# Patient Record
Sex: Male | Born: 1945 | Race: White | Hispanic: No | Marital: Single
Health system: Southern US, Community
[De-identification: ages and names within clinical notes are randomized; demographics above are authoritative.]

---

## 2016-05-13 ENCOUNTER — Inpatient Hospital Stay
Admission: RE | Admit: 2016-05-13 | Discharge: 2016-06-04 | Disposition: A | Discharge status: Skilled Nursing Facility | Payer: Medicare Other | Source: Other Acute Inpatient Hospital | Attending: Internal Medicine | Admitting: Internal Medicine

## 2016-05-13 DIAGNOSIS — Z4659 Encounter for fitting and adjustment of other gastrointestinal appliance and device: Secondary | ICD-10-CM

## 2016-05-13 DIAGNOSIS — J9690 Respiratory failure, unspecified, unspecified whether with hypoxia or hypercapnia: Secondary | ICD-10-CM

## 2016-05-13 DIAGNOSIS — R1310 Dysphagia, unspecified: Secondary | ICD-10-CM

## 2016-05-13 DIAGNOSIS — Z931 Gastrostomy status: Secondary | ICD-10-CM

## 2016-05-13 DIAGNOSIS — Z0189 Encounter for other specified special examinations: Secondary | ICD-10-CM

## 2016-05-14 ENCOUNTER — Other Ambulatory Visit (HOSPITAL_COMMUNITY): Payer: Medicare Other

## 2016-05-14 LAB — URINALYSIS, ROUTINE W REFLEX MICROSCOPIC
BILIRUBIN URINE: NEGATIVE
GLUCOSE, UA: NEGATIVE mg/dL
HGB URINE DIPSTICK: NEGATIVE
Ketones, ur: NEGATIVE mg/dL
Leukocytes, UA: NEGATIVE
Nitrite: NEGATIVE
PH: 5.5 (ref 5.0–8.0)
Protein, ur: NEGATIVE mg/dL
SPECIFIC GRAVITY, URINE: 1.025 (ref 1.005–1.030)

## 2016-05-14 LAB — BASIC METABOLIC PANEL
ANION GAP: 10 (ref 5–15)
BUN: 31 mg/dL — ABNORMAL HIGH (ref 6–20)
CALCIUM: 10 mg/dL (ref 8.9–10.3)
CO2: 27 mmol/L (ref 22–32)
Chloride: 99 mmol/L — ABNORMAL LOW (ref 101–111)
Creatinine, Ser: 1.19 mg/dL (ref 0.61–1.24)
Glucose, Bld: 126 mg/dL — ABNORMAL HIGH (ref 65–99)
POTASSIUM: 5 mmol/L (ref 3.5–5.1)
Sodium: 136 mmol/L (ref 135–145)

## 2016-05-14 LAB — CBC
HEMATOCRIT: 43.2 % (ref 39.0–52.0)
Hemoglobin: 14.7 g/dL (ref 13.0–17.0)
MCH: 28.4 pg (ref 26.0–34.0)
MCHC: 34 g/dL (ref 30.0–36.0)
MCV: 83.6 fL (ref 78.0–100.0)
PLATELETS: 322 10*3/uL (ref 150–400)
RBC: 5.17 MIL/uL (ref 4.22–5.81)
RDW: 14.1 % (ref 11.5–15.5)
WBC: 13.6 10*3/uL — AB (ref 4.0–10.5)

## 2016-05-14 LAB — MAGNESIUM: MAGNESIUM: 2.5 mg/dL — AB (ref 1.7–2.4)

## 2016-05-14 LAB — APTT: APTT: 38 s — AB (ref 24–36)

## 2016-05-14 LAB — PROTIME-INR
INR: 1.11
Prothrombin Time: 14.3 seconds (ref 11.4–15.2)

## 2016-05-14 LAB — TSH: TSH: 0.693 u[IU]/mL (ref 0.350–4.500)

## 2016-05-15 ENCOUNTER — Other Ambulatory Visit (HOSPITAL_COMMUNITY): Payer: Medicare Other

## 2016-05-15 LAB — COMPREHENSIVE METABOLIC PANEL
ALK PHOS: 79 U/L (ref 38–126)
ALT: 174 U/L — AB (ref 17–63)
AST: 93 U/L — AB (ref 15–41)
Albumin: 3.1 g/dL — ABNORMAL LOW (ref 3.5–5.0)
Anion gap: 13 (ref 5–15)
BUN: 38 mg/dL — AB (ref 6–20)
CALCIUM: 10.4 mg/dL — AB (ref 8.9–10.3)
CHLORIDE: 100 mmol/L — AB (ref 101–111)
CO2: 25 mmol/L (ref 22–32)
CREATININE: 1.2 mg/dL (ref 0.61–1.24)
GFR calc non Af Amer: 60 mL/min — ABNORMAL LOW (ref >60)
Glucose, Bld: 131 mg/dL — ABNORMAL HIGH (ref 65–99)
Potassium: 4.9 mmol/L (ref 3.5–5.1)
SODIUM: 138 mmol/L (ref 135–145)
Total Bilirubin: 0.6 mg/dL (ref 0.3–1.2)
Total Protein: 8.3 g/dL — ABNORMAL HIGH (ref 6.5–8.1)

## 2016-05-15 LAB — CBC WITH DIFFERENTIAL/PLATELET
Basophils Absolute: 0 10*3/uL (ref 0.0–0.1)
Basophils Relative: 0 %
Eosinophils Absolute: 0.1 10*3/uL (ref 0.0–0.7)
Eosinophils Relative: 1 %
HCT: 43.1 % (ref 39.0–52.0)
Hemoglobin: 14.5 g/dL (ref 13.0–17.0)
Lymphocytes Relative: 15 %
Lymphs Abs: 2.2 10*3/uL (ref 0.7–4.0)
MCH: 28 pg (ref 26.0–34.0)
MCHC: 33.6 g/dL (ref 30.0–36.0)
MCV: 83.2 fL (ref 78.0–100.0)
Monocytes Absolute: 1.8 10*3/uL — ABNORMAL HIGH (ref 0.1–1.0)
Monocytes Relative: 12 %
Neutro Abs: 10.5 10*3/uL — ABNORMAL HIGH (ref 1.7–7.7)
Neutrophils Relative %: 72 %
Platelets: 341 10*3/uL (ref 150–400)
RBC: 5.18 MIL/uL (ref 4.22–5.81)
RDW: 14 % (ref 11.5–15.5)
WBC: 15.7 10*3/uL — ABNORMAL HIGH (ref 4.0–10.5)

## 2016-05-16 ENCOUNTER — Other Ambulatory Visit (HOSPITAL_COMMUNITY): Payer: Medicare Other

## 2016-05-16 NOTE — Progress Notes (Signed)
Patient ID: Rick HoardScott Alvarado, male   DOB: [birth year], 70 y.o.   MRN: [id number]   Request received for percutaneous gastric tube placement  CT Imaging 11/16:  IMPRESSION: 1. The transverse colon overlies the entirety of the stomach precluding access for percutaneous gastrostomy tube placement. Surgical consultation for gastrostomy tube placement is recommended if enteric tube placement is still desired. 2. Aortic Atherosclerosis (ICD10-170.0)  Will cancel IR request RN aware  Will need surgical consult for G tube

## 2016-05-17 LAB — URINE CULTURE

## 2016-05-23 LAB — CBC
HCT: 39.9 % (ref 39.0–52.0)
Hemoglobin: 13.6 g/dL (ref 13.0–17.0)
MCH: 28.3 pg (ref 26.0–34.0)
MCHC: 34.1 g/dL (ref 30.0–36.0)
MCV: 83.1 fL (ref 78.0–100.0)
PLATELETS: 225 10*3/uL (ref 150–400)
RBC: 4.8 MIL/uL (ref 4.22–5.81)
RDW: 13.5 % (ref 11.5–15.5)
WBC: 7.3 10*3/uL (ref 4.0–10.5)

## 2016-05-23 LAB — BASIC METABOLIC PANEL
ANION GAP: 11 (ref 5–15)
BUN: 17 mg/dL (ref 6–20)
CALCIUM: 9.9 mg/dL (ref 8.9–10.3)
CO2: 21 mmol/L — ABNORMAL LOW (ref 22–32)
Chloride: 101 mmol/L (ref 101–111)
Creatinine, Ser: 1.05 mg/dL (ref 0.61–1.24)
Glucose, Bld: 101 mg/dL — ABNORMAL HIGH (ref 65–99)
POTASSIUM: 4.6 mmol/L (ref 3.5–5.1)
SODIUM: 133 mmol/L — AB (ref 135–145)

## 2016-05-31 LAB — CBC
HCT: 39.8 % (ref 39.0–52.0)
Hemoglobin: 13.5 g/dL (ref 13.0–17.0)
MCH: 27.8 pg (ref 26.0–34.0)
MCHC: 33.9 g/dL (ref 30.0–36.0)
MCV: 82.1 fL (ref 78.0–100.0)
PLATELETS: 227 10*3/uL (ref 150–400)
RBC: 4.85 MIL/uL (ref 4.22–5.81)
RDW: 14 % (ref 11.5–15.5)
WBC: 6.6 10*3/uL (ref 4.0–10.5)

## 2016-05-31 LAB — COMPREHENSIVE METABOLIC PANEL
ALBUMIN: 3.2 g/dL — AB (ref 3.5–5.0)
ALT: 29 U/L (ref 17–63)
ANION GAP: 12 (ref 5–15)
AST: 25 U/L (ref 15–41)
Alkaline Phosphatase: 70 U/L (ref 38–126)
BILIRUBIN TOTAL: 0.7 mg/dL (ref 0.3–1.2)
BUN: 12 mg/dL (ref 6–20)
CO2: 24 mmol/L (ref 22–32)
Calcium: 9.9 mg/dL (ref 8.9–10.3)
Chloride: 98 mmol/L — ABNORMAL LOW (ref 101–111)
Creatinine, Ser: 0.94 mg/dL (ref 0.61–1.24)
GFR calc Af Amer: >60 mL/min (ref >60)
GFR calc non Af Amer: >60 mL/min (ref >60)
GLUCOSE: 83 mg/dL (ref 65–99)
POTASSIUM: 4.2 mmol/L (ref 3.5–5.1)
SODIUM: 134 mmol/L — AB (ref 135–145)
TOTAL PROTEIN: 7 g/dL (ref 6.5–8.1)

## 2017-10-29 DEATH — deceased

## 2017-12-10 IMAGING — CR DG ABD PORTABLE 1V
3 series · 3 of 3 positions shown · non-contrast
Comparison: 05/15/2016 at [DATE]

CLINICAL DATA: Nasogastric tube placement

EXAM:
PORTABLE ABDOMEN - 1 VIEW

[AP (1 of 3)]
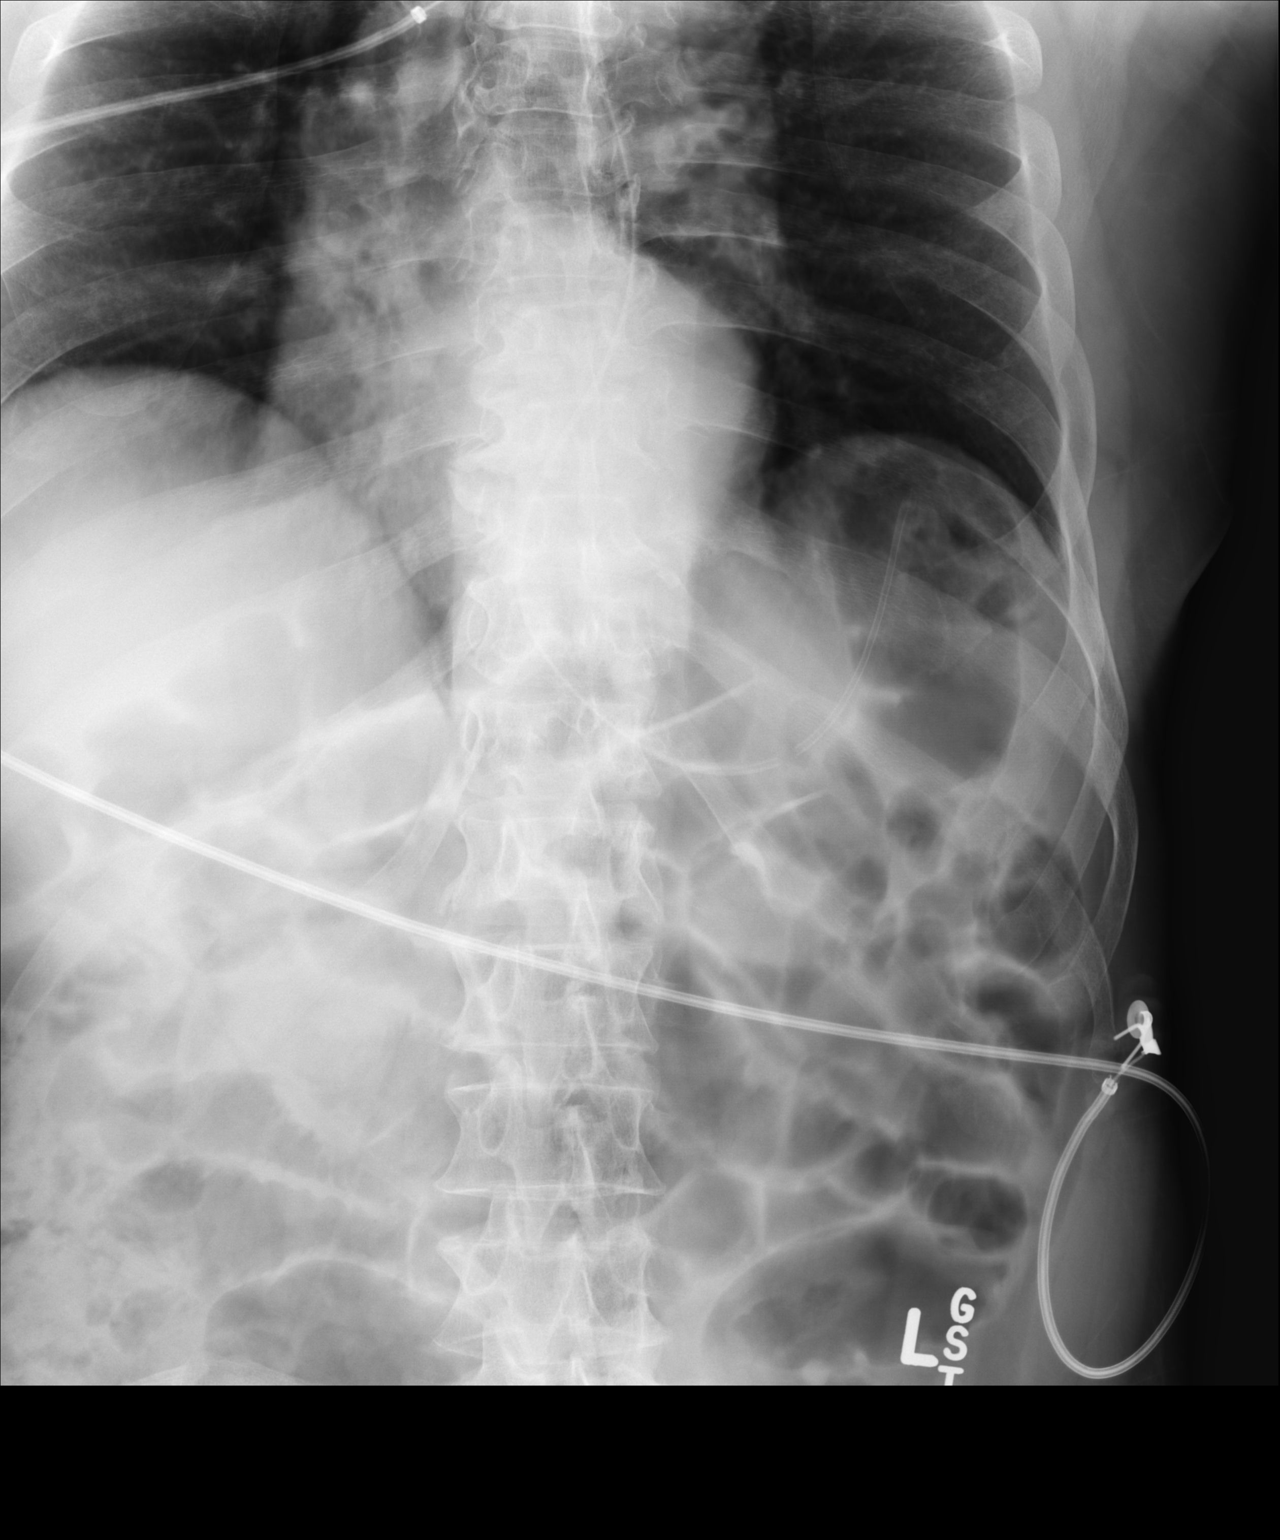

[AP (2 of 3)]
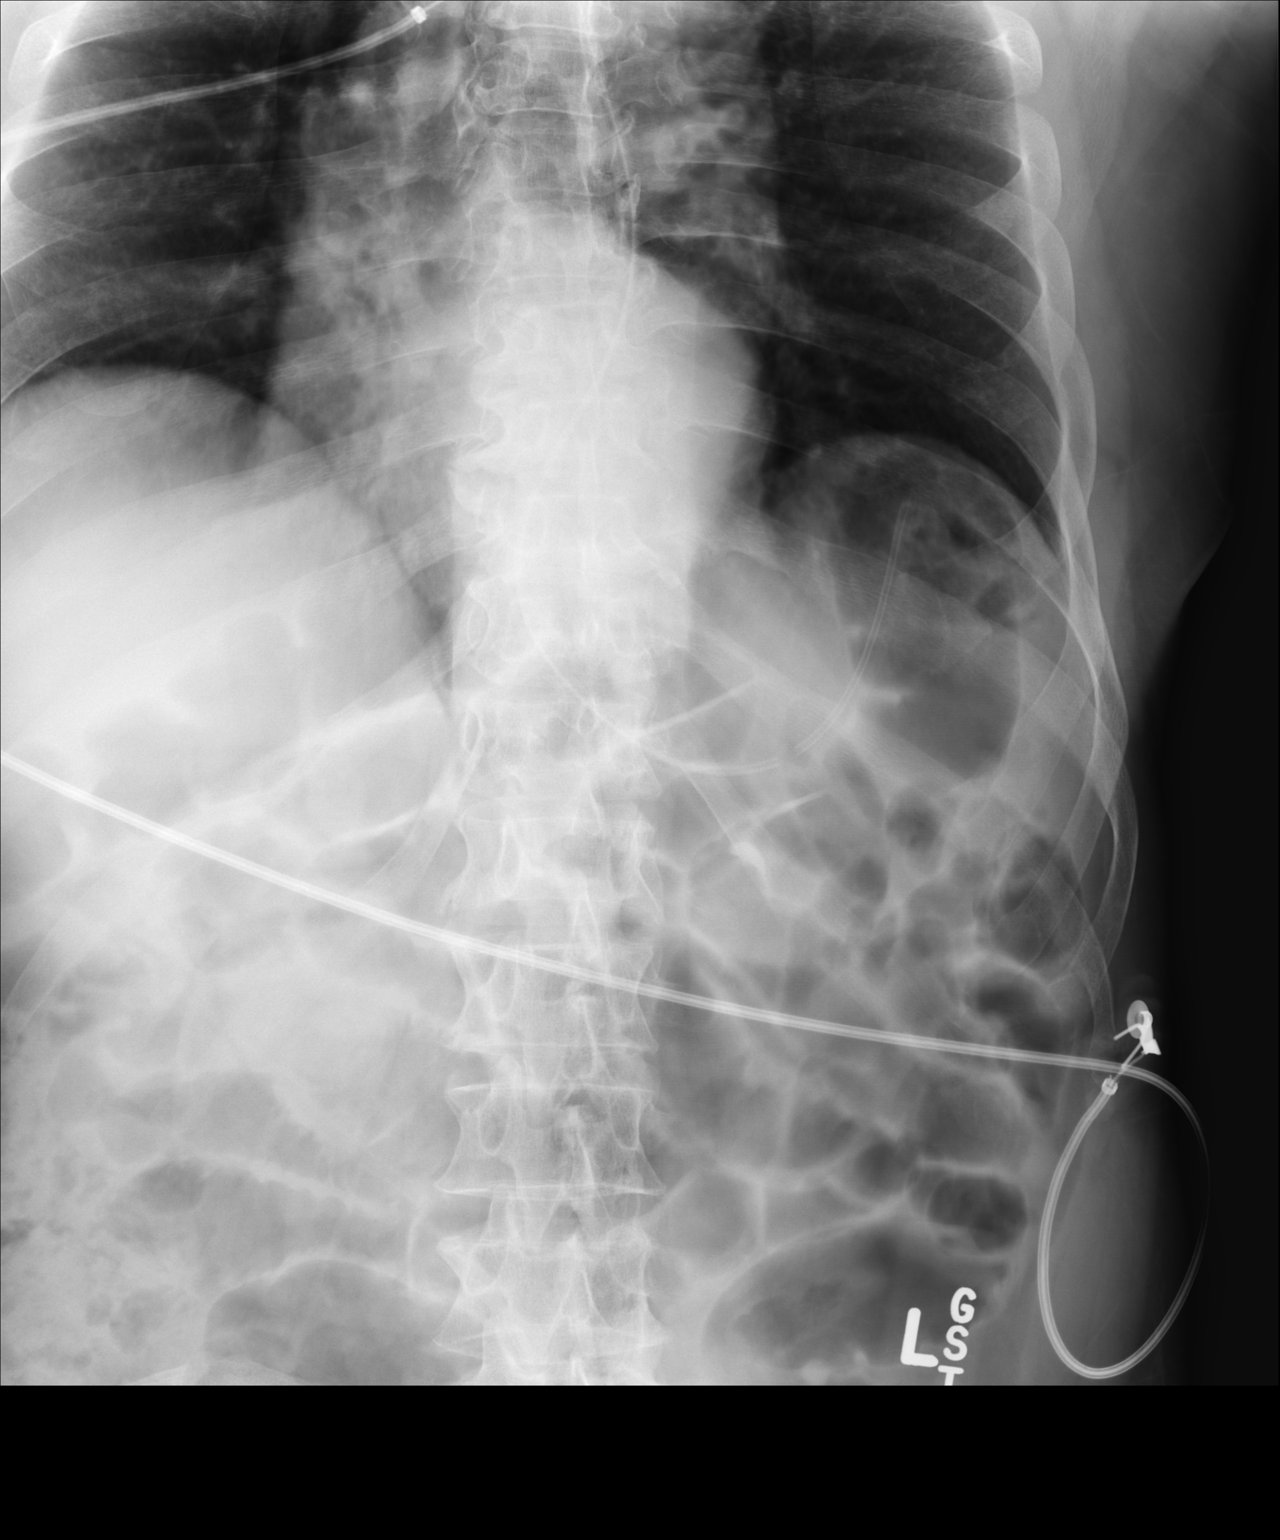

[AP (3 of 3)]
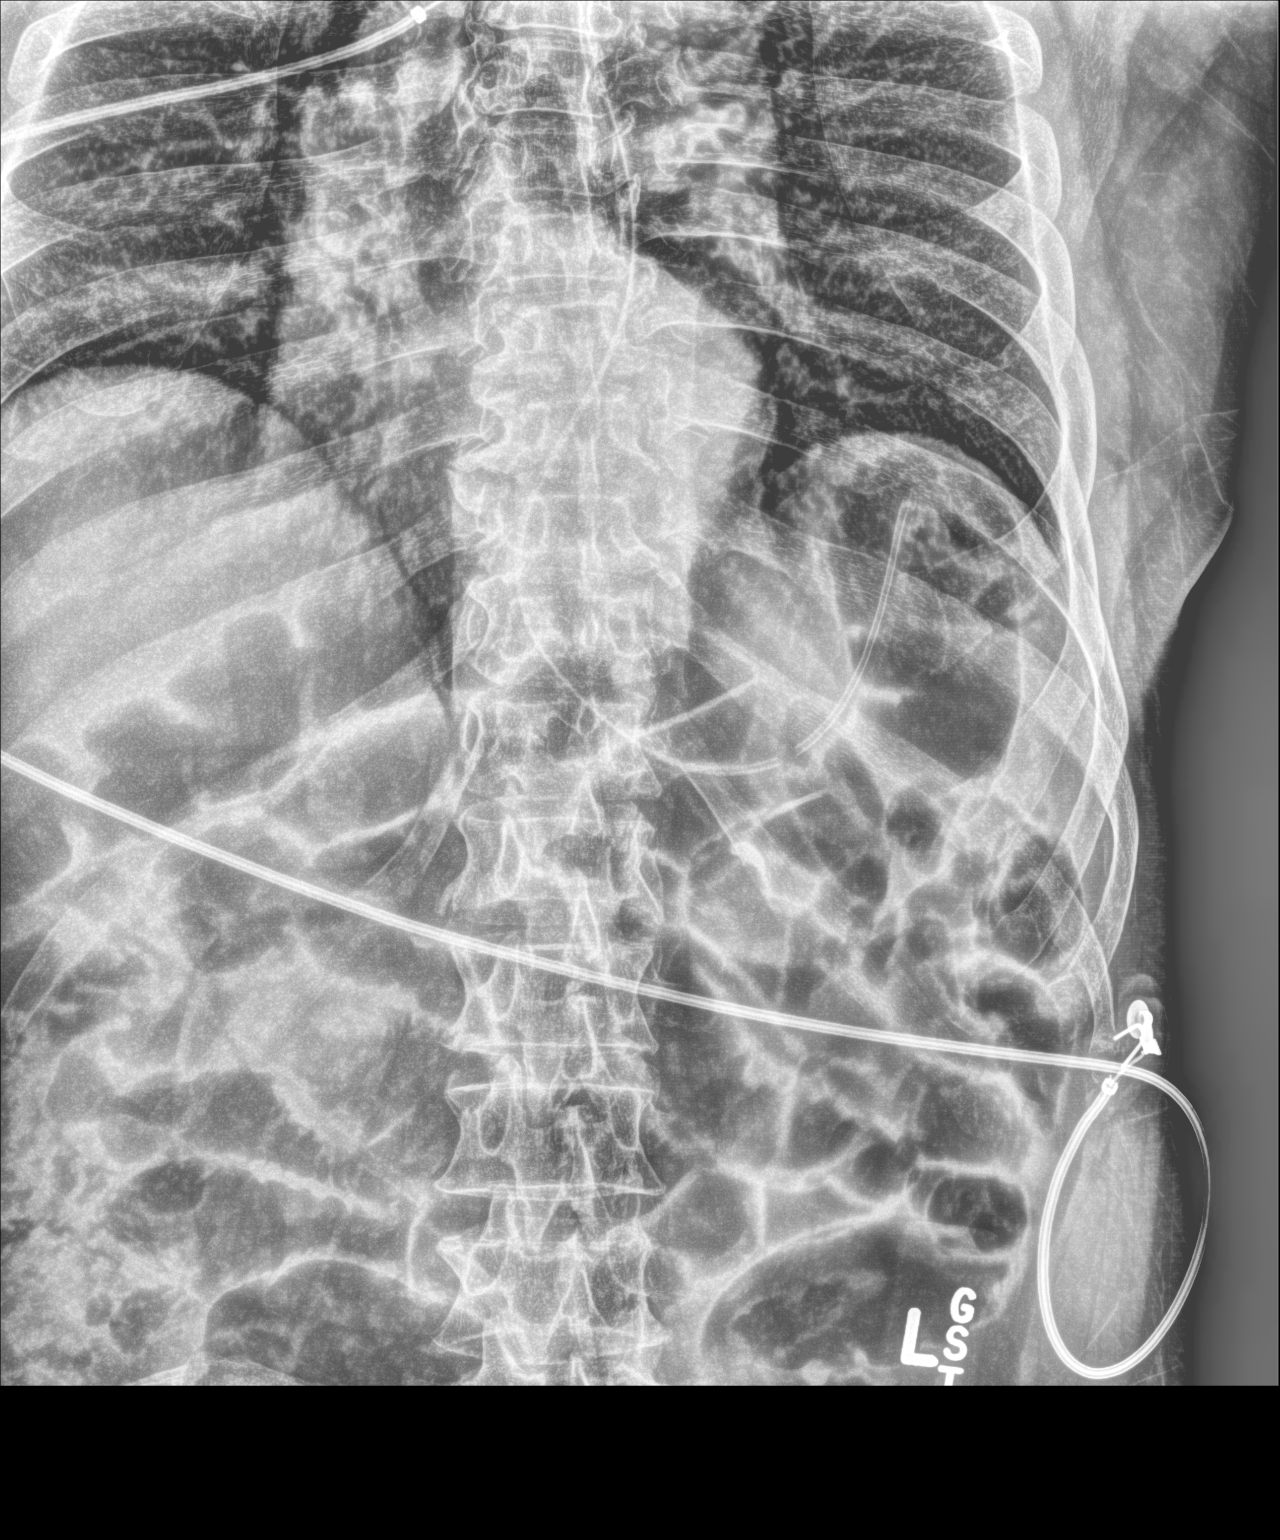

[3 of 3 positions shown; findings below may reference images not displayed]

FINDINGS: The nasoenteric tube extends well into the stomach with tip in the
region of the gastric fundus. There is a large volume air throughout
bowel.
IMPRESSION: Enteric tube extends well into the stomach.
